# Patient Record
Sex: Male | Born: 1945 | Race: White | Hispanic: No | Marital: Married | Smoking: Never smoker
Health system: Southern US, Community
[De-identification: ages and names within clinical notes are randomized; demographics above are authoritative.]

## PROBLEM LIST (undated history)

## (undated) DIAGNOSIS — F809 Developmental disorder of speech and language, unspecified: Secondary | ICD-10-CM

## (undated) DIAGNOSIS — I10 Essential (primary) hypertension: Secondary | ICD-10-CM

## (undated) DIAGNOSIS — R479 Unspecified speech disturbances: Secondary | ICD-10-CM

## (undated) DIAGNOSIS — J189 Pneumonia, unspecified organism: Secondary | ICD-10-CM

## (undated) DIAGNOSIS — I679 Cerebrovascular disease, unspecified: Secondary | ICD-10-CM

## (undated) DIAGNOSIS — E7800 Pure hypercholesterolemia, unspecified: Secondary | ICD-10-CM

## (undated) DIAGNOSIS — M109 Gout, unspecified: Secondary | ICD-10-CM

## (undated) DIAGNOSIS — G4700 Insomnia, unspecified: Secondary | ICD-10-CM

## (undated) DIAGNOSIS — M1990 Unspecified osteoarthritis, unspecified site: Secondary | ICD-10-CM

## (undated) HISTORY — DX: Developmental disorder of speech and language, unspecified: F80.9

## (undated) HISTORY — DX: Pneumonia, unspecified organism: J18.9

## (undated) HISTORY — DX: Unspecified osteoarthritis, unspecified site: M19.90

## (undated) HISTORY — DX: Cerebrovascular disease, unspecified: I67.9

## (undated) HISTORY — DX: Unspecified speech disturbances: R47.9

## (undated) HISTORY — DX: Pure hypercholesterolemia, unspecified: E78.00

## (undated) HISTORY — PX: TONSILECTOMY, ADENOIDECTOMY, BILATERAL MYRINGOTOMY AND TUBES: SHX2538

## (undated) HISTORY — DX: Insomnia, unspecified: G47.00

## (undated) HISTORY — DX: Essential (primary) hypertension: I10

## (undated) HISTORY — DX: Gout, unspecified: M10.9

## (undated) HISTORY — PX: HEMORRHOID SURGERY: SHX153

---

## 1998-05-19 ENCOUNTER — Encounter: Payer: Self-pay | Admitting: *Deleted

## 1998-05-19 ENCOUNTER — Ambulatory Visit (HOSPITAL_COMMUNITY): Admission: RE | Admit: 1998-05-19 | Discharge: 1998-05-19 | Payer: Self-pay | Admitting: *Deleted

## 2002-04-01 DIAGNOSIS — I679 Cerebrovascular disease, unspecified: Secondary | ICD-10-CM

## 2002-04-01 HISTORY — DX: Cerebrovascular disease, unspecified: I67.9

## 2002-10-12 ENCOUNTER — Inpatient Hospital Stay (HOSPITAL_COMMUNITY): Admission: EM | Admit: 2002-10-12 | Discharge: 2002-10-15 | Payer: Self-pay | Admitting: Emergency Medicine

## 2002-10-12 ENCOUNTER — Encounter: Payer: Self-pay | Admitting: Emergency Medicine

## 2002-10-12 ENCOUNTER — Encounter: Payer: Self-pay | Admitting: Neurology

## 2002-10-13 ENCOUNTER — Encounter: Payer: Self-pay | Admitting: Neurology

## 2002-10-14 ENCOUNTER — Encounter: Payer: Self-pay | Admitting: Cardiology

## 2002-10-20 ENCOUNTER — Encounter: Admission: RE | Admit: 2002-10-20 | Discharge: 2002-10-20 | Payer: Self-pay | Admitting: Neurology

## 2004-06-29 ENCOUNTER — Encounter (INDEPENDENT_AMBULATORY_CARE_PROVIDER_SITE_OTHER): Payer: Self-pay | Admitting: *Deleted

## 2004-06-29 ENCOUNTER — Ambulatory Visit (HOSPITAL_COMMUNITY): Admission: RE | Admit: 2004-06-29 | Discharge: 2004-06-29 | Payer: Self-pay | Admitting: Surgery

## 2008-05-24 ENCOUNTER — Emergency Department (HOSPITAL_COMMUNITY): Admission: EM | Admit: 2008-05-24 | Discharge: 2008-05-24 | Payer: Self-pay | Admitting: Emergency Medicine

## 2008-05-28 ENCOUNTER — Emergency Department (HOSPITAL_COMMUNITY): Admission: EM | Admit: 2008-05-28 | Discharge: 2008-05-28 | Payer: Self-pay | Admitting: Emergency Medicine

## 2010-07-17 LAB — COMPREHENSIVE METABOLIC PANEL
Alkaline Phosphatase: 181 U/L — ABNORMAL HIGH (ref 39–117)
BUN: 13 mg/dL (ref 6–23)
Chloride: 106 mEq/L (ref 96–112)
Creatinine, Ser: 0.94 mg/dL (ref 0.4–1.5)
Glucose, Bld: 102 mg/dL — ABNORMAL HIGH (ref 70–99)
Potassium: 3.7 mEq/L (ref 3.5–5.1)
Total Bilirubin: 1.3 mg/dL — ABNORMAL HIGH (ref 0.3–1.2)
Total Protein: 5.8 g/dL — ABNORMAL LOW (ref 6.0–8.3)

## 2010-07-17 LAB — URINE MICROSCOPIC-ADD ON

## 2010-07-17 LAB — POCT I-STAT, CHEM 8
BUN: 19 mg/dL (ref 6–23)
Chloride: 106 mEq/L (ref 96–112)
Creatinine, Ser: 1.3 mg/dL (ref 0.4–1.5)
Glucose, Bld: 125 mg/dL — ABNORMAL HIGH (ref 70–99)
HCT: 43 % (ref 39.0–52.0)
Potassium: 3.7 mEq/L (ref 3.5–5.1)

## 2010-07-17 LAB — DIFFERENTIAL
Basophils Absolute: 0 10*3/uL (ref 0.0–0.1)
Basophils Relative: 1 % (ref 0–1)
Basophils Relative: 1 % (ref 0–1)
Lymphocytes Relative: 15 % (ref 12–46)
Lymphocytes Relative: 5 % — ABNORMAL LOW (ref 12–46)
Lymphs Abs: 0.4 10*3/uL — ABNORMAL LOW (ref 0.7–4.0)
Monocytes Relative: 1 % — ABNORMAL LOW (ref 3–12)
Neutro Abs: 4.6 10*3/uL (ref 1.7–7.7)
Neutro Abs: 7.9 10*3/uL — ABNORMAL HIGH (ref 1.7–7.7)
Neutrophils Relative %: 70 % (ref 43–77)
Neutrophils Relative %: 94 % — ABNORMAL HIGH (ref 43–77)

## 2010-07-17 LAB — URINALYSIS, ROUTINE W REFLEX MICROSCOPIC
Bilirubin Urine: NEGATIVE
Glucose, UA: NEGATIVE mg/dL
Hgb urine dipstick: NEGATIVE
Nitrite: NEGATIVE
Specific Gravity, Urine: 1.028 (ref 1.005–1.030)
Specific Gravity, Urine: 1.03 (ref 1.005–1.030)
Urobilinogen, UA: 1 mg/dL (ref 0.0–1.0)
pH: 6 (ref 5.0–8.0)

## 2010-07-17 LAB — CBC
HCT: 35.9 % — ABNORMAL LOW (ref 39.0–52.0)
Hemoglobin: 12.6 g/dL — ABNORMAL LOW (ref 13.0–17.0)
MCV: 87.9 fL (ref 78.0–100.0)
RBC: 4.08 MIL/uL — ABNORMAL LOW (ref 4.22–5.81)
RBC: 4.71 MIL/uL (ref 4.22–5.81)
RDW: 13.3 % (ref 11.5–15.5)
WBC: 6.6 10*3/uL (ref 4.0–10.5)
WBC: 8.4 10*3/uL (ref 4.0–10.5)

## 2010-07-17 LAB — LIPASE, BLOOD: Lipase: 24 U/L (ref 11–59)

## 2010-08-17 NOTE — Op Note (Signed)
NAME:  DONTAYE, HUR NO.:  [id number]   MEDICAL RECORD NO.:  [id number]          PATIENT TYPE:  AMB   LOCATION:  DAY                          FACILITY:  [facility]   PHYSICIAN:  Thornton Park. Daphine Deutscher, MD  DATE OF BIRTH:  [birth year]   DATE OF PROCEDURE:  06/29/2004  DATE OF DISCHARGE:                                 OPERATIVE REPORT   PREOPERATIVE DIAGNOSIS:  Prolapsing and bleeding hemorrhoids.   POSTOPERATIVE DIAGNOSIS:  Prolapsing and bleeding hemorrhoids.   PROCEDURE:  Procedure for prolapsing hemorrhoids.   ASSISTANT:  Vikki Ports, M.D.   ANESTHESIA:  General in the prone position.   DESCRIPTION OF PROCEDURE:  Kyron Schlitt was seen again by me in the  holding area with his wife in accompaniment and I described the procedure  with him in detail, including potential risks and benefits and  complications.  The patient was then taken back to room 11 on June 29, 2004  and given general anesthesia.  He was rolled over into the prone position.  The patient's anus was dilated to 2 fingerbreadths manually.  The area was  then taped with the buttocks apart and he was injected with Wydase into the  hemorrhoidal columns.  Then, 20 cc of 0.5% Marcaine were injected into the  sphincters.   I then used the anal retractor.  With the anal retractor in place, we began  the sewing posteriorly in a concentric fashion, taking purchases of the  mucosa with the 2-0 Prolene.  This was carried around at about 4 cm in from  the dentate line.  When this was completed, we withdrew this dilator and  then inserted the PPH dilator.  We then inserted the stapler through the  purse-string and tied this concentric purse-string down around the anvil.  I  then brought each end through so I could maintain tension and then close the  stapler, maintaining the alignment of the anal retractor.  When closed, it  was clamped, released a minute, and then fired.  A half turn enabled me to  remove the entire stapler with the intact ring on the inside.  This was a  nice uniform ring that contained multiple hemorrhoidal areas.   I then went back in with a smaller anal retractor and found one little area  of bleeding posteriorly, which was closed with simple 4-0 Vicryl.  The  remaining portion of the staple line was dry.  A pack of Gelfoam was then  placed in there.  The patient was taken to the recovery room in satisfactory  condition.  He was given a prescription for Tylox to take for pain.  He will  be followed up in the office in approximately 3 weeks.      MBM/MEDQ  D:  06/29/2004  T:  06/29/2004  Job:  [id number]   cc:   Olga Millers, M.D. [facility]   Marlan Palau, M.D.  [address]  [address]  [place]  [place] [address]  Fax: [phone]   Elana Alm. Nicholos Johns, M.D.  [address]  [place]  [place] [address]  Fax: [phone]

## 2010-08-17 NOTE — H&P (Signed)
NAME:  Joe Richards, Joe Richards                     ACCOUNT NO.:  [id number]   MEDICAL RECORD NO.:  [id number]                   PATIENT TYPE:  EMS   LOCATION:  MAJO                                 FACILITY:  [facility]   PHYSICIAN:  Genene Churn. Love, M.D.                 DATE OF BIRTH:  [birth year]   DATE OF ADMISSION:  10/12/2002  DATE OF DISCHARGE:                                HISTORY & PHYSICAL   This is the first Joe Richards hospital admission for this 65 year old right  handed white married male from [place], [place], seen in  consultation and admitted for evaluation of dysarthria and dysphagia.   HISTORY OF PRESENT ILLNESS:  Mr. Gildner has no known history of high  blood pressure, diabetes, heart disease, or stroke. He was in his usual  state of health until October 12, 2002, when he developed acute onset of sleep  disturbance and swallowing difficulties at about 6 a.m. At that time, he was  eating cereal. There was no associated chest pain, palpitations, focal arm  or leg weakness, numbness, double vision, hiccups, swallowing problems, or  nausea and vomiting. His son was called who brought him to the emergency  room about 3:00 in the afternoon, and he was seen by Dr. Lynelle Doctor who referred  him for neurologic consultation. The patient has not been on aspirin. There  is no history of hyperlipidemia. No history of drug use. No known history of  migraine and denies any head or neck trauma. He has had no symptoms like  this previously in his life.   His past medical history is significant for gout. He currently takes no  medications. He does not have a position. He is educated through high school  and works as a Curator. He is married and has a son, 55, and daughter, 27,  living and well.   FAMILY HISTORY:  His mother died at 38 with diabetes mellitus. Father died  of 12 with a gunshot wound and suicide. He has a brother, 30, and a brother,  72, and sister, 31, all of whom are  living and well. There is no family  history of other serious medical problems.   PHYSICAL EXAMINATION:  GENERAL:  Revealed a well-developed white male.  VITAL SIGNS:  Blood pressure in the right and left arm 140/80, heart rate 50  and regular. Were no carotid or supraclavicular bruits.  HEENT:  The tympanic membranes were clear.  NECK:  Supple.  NEUROLOGICAL:  Mental status:  He was alert and oriented x3 and followed  one, two, and three step commands. Cranial nerve examination was significant  for dysarthria. His pupils:  Left __________ than right; it was about 4.5,  the right was 4. There was no definite ptosis. The extraocular movements  were full. There was no nystagmus. The red lens testing revealed no definite  diplopia. His corneals were present. Facial sensation was equal. I could  not  put up any facial motor asymmetric, but he did have a mustache. His tongue  was midline. The uvula was midline and gag was in place. He had a  significant dysarthria. Sternocleidomastoid, trapezius testing normal motor  examination with good strength in the upper and lower extremities. He did  not have a significant tremor. Finger to nose and heel to shin were well  done. Sensory examination was intact to pinprick, touch, position, and  vibration testing. Deep tendon reflexes were 1 to 2+. Plantar responses were  downgoing. Gait examination revealed that he could stand by the bedside. He  could stand on his toes. He could stand on his heels. He had a tendency to  fall to his right on standing.  GENERAL EXAMINATION:  The general examination revealed tympanic membranes to  the clear. Mouth was in good repair.  LUNGS:  Clear to auscultation.  HEART:  Examination of the heart revealed no murmurs.  ABDOMEN:  Bowel sounds were normal.  EXTREMITIES:  There was no clubbing, cyanosis, or edema in the extremities.   IMPRESSION:  1. Dysarthria, code 784.5.  2. Dysphagia, code 787.2.  3. Gout, code  unknown.   PLAN:  At this time to give the patient rectal aspirin and admit him for  further evaluation and consideration such as brain stem stroke, myasthenia  gravis, and possibly even mass lesion in the posterior fossa may need to be  considered. Etiology of this symptomatology at this time is not clear.                                               Genene Churn. Sandria Manly, M.D.    JML/MEDQ  D:  10/12/2002  T:  10/12/2002  Job:  [id number]

## 2010-08-17 NOTE — Discharge Summary (Signed)
NAME:  Joe Richards, Joe Richards                     ACCOUNT NO.:  [id number]   MEDICAL RECORD NO.:  [id number]                   PATIENT TYPE:  INP   LOCATION:  3021                                 FACILITY:  [facility]   PHYSICIAN:  Melvyn Novas, M.D.               DATE OF BIRTH:  09/16/1945   DATE OF ADMISSION:  10/12/2002  DATE OF DISCHARGE:  10/15/2002                                 DISCHARGE SUMMARY   DISCHARGE DIAGNOSES:  1. 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  2. Gout.   DISCHARGE MEDICATIONS:  Aspirin 325 mg daily.   STUDIES PERFORMED:  1. CT of the head on admission shows no acute abnormalities.  2. Chest x-ray shows no active lung disease, mild cardiomegaly.  3. MRI of the brain shows acute infarct of the left parietal lobe.  4. MRA of the head was negative.  5. 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  6. Carotid Dopplers which were normal.  7. A 2-D echocardiogram - normal left ejection fraction of 55 to 65%, no     left ventricular regional wall abnormalities, no echocardiographic source     of cardiac embolism.  8. A transesophageal echocardiogram performed by Dr. Olga Millers on October 15, 2002, shows normal left ventricular function, no LA thrombus, no     pericardial effusion, mild atherosclerosis of the descending aorta,     normal trileaflet aortic valve, mildly thickened mitral valve with trace     of mitral regurgitation with oval density on mitral valve, most likely     fibrin stranding, trace tricuspid regurgitation, no IA,  by color,     negative __________ microcavitation study.  No complications.  The     patient tolerated it well.  9. EKG showing normal sinus rhythm.   LABORATORY DATA:  Serum drug screen negative.  Homocystine 9.41.  Sedimentation rate 9.  ANA pending.  Anticardiolipin antibody pending.  Lupus anticoagulant normal.  Protein C total pending.  Protein S  fractional  109, protein C fractional 130.  White blood cell count 5.4, red blood cells  4.8, hemoglobin 14.4, hematocrit 41.1, platelets 197, differential normal.  Sodium 142, potassium 3.7, chloride 106, CO2 30, glucose 97, BUN 14,  creatinine 0.9, calcium 8.9, total protein 6.8, albumin 3.9.  AST 20, ALT  27, ALP 90, total bilirubin 0.8.  Lipid profile with cholesterol 156,  triglycerides 159, HDL 35, and LDL 89.  UA showed small leukocyte esterase  with 0 to 2 red blood cells and white blood cells.   HISTORY OF PRESENT ILLNESS:  Joe Richards is a 65 year old right-  handed white male with no significant medical history.  He developed acute  onset of sleep disturbance, with swallowing difficulties about 6 a.m. on the  morning of admission while eating cereal.  There was no associated symptoms  with it.  His son was called and brought him to the emergency room at about  3:00 in the afternoon, and was asked by the EDP to be seen by a neurologist.  He was out of the TPA window.  He was admitted for further stroke workup.   HOSPITAL COURSE:  MRI did reveal an acute left parietal infarction of  moderate size.  Workup was negative for etiology, including a carotid  Doppler, MRA, 2D echo, and transesophageal echocardiogram performed by Dr.  Olga Millers.  The patient was on aspirin prior to admission, thus was  discharged on aspirin 325 mg a day for secondary stroke prevention.  No  other risk factors for stroke were identified.   He remained with good strength and sensation during hospitalization.  Speech  therapy did see him and assessed him to tolerate a dysphagia III thin liquid  diet.  He will have an outpatient speech therapy followup.  Hypercoagulable  workup was initiated; this will also need followup.   CONDITION ON DISCHARGE:  The patient is alert and oriented x3.  Speech is  slightly slurred with some anomia remaining.  He does have decreased  spontaneous speech.  He  is able to repeat.  His chest is clear to  auscultation.  His heart rate is regular.  His strength is normal without  drift.   DISCHARGE PLAN:  Discharged home with followup outpatient speech therapy.   DISCHARGE MEDICATIONS:  Aspirin for secondary stroke prevention.   FOLLOWUP:  1. Hypercoagulable workup as an outpatient.  2. Follow up with Dr. Anne Hahn in the office in three to four weeks.     Annie Main, N.P.                         Melvyn Novas, M.D.    SB/MEDQ  D:  10/15/2002  T:  10/17/2002  Job:  [id number]   cc:   Marlan Palau, M.D.  [address]  [address]  [place]  [place] [address]  Fax: [phone]   Olga Millers, M.D.    cc:   Marlan Palau, M.D.  [address]  [address]  [place]  [place] [address]  Fax: [phone]   Olga Millers, M.D.

## 2011-08-13 DIAGNOSIS — R42 Dizziness and giddiness: Secondary | ICD-10-CM | POA: Diagnosis not present

## 2011-08-13 DIAGNOSIS — I658 Occlusion and stenosis of other precerebral arteries: Secondary | ICD-10-CM | POA: Diagnosis not present

## 2011-08-21 ENCOUNTER — Encounter: Payer: Self-pay | Admitting: Gastroenterology

## 2011-09-20 ENCOUNTER — Encounter: Payer: Self-pay | Admitting: Gastroenterology

## 2011-09-20 ENCOUNTER — Ambulatory Visit (INDEPENDENT_AMBULATORY_CARE_PROVIDER_SITE_OTHER): Payer: BC Managed Care – PPO | Admitting: Gastroenterology

## 2011-09-20 VITALS — BP 140/64 | HR 78 | Ht 65.0 in | Wt 151.0 lb

## 2011-09-20 DIAGNOSIS — K921 Melena: Secondary | ICD-10-CM

## 2011-09-20 NOTE — Progress Notes (Signed)
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  Recent physical.  +FOBT stools.  CBC last month shows normal hemoglobin, normal MCV.  Had hemorrhoid surgery 4-5 years, stapling. Had minor bleeding with hard stool over the weekend.   Constipation however is not generally much of a bother for him. He has no significant abdominal pains.  Weight is a bit higher than his usual.   Review of systems: Pertinent positive and negative review of systems were noted in the above HPI section. Complete review of systems was performed and was otherwise normal.    Past Medical History  Diagnosis Date  . Cerebrovascular disease 2004  . Speech and language deficits   . HTN (hypertension)   . Pneumonia   . Insomnia   . OA (osteoarthritis)     shoulder  . Gout   . Hypercholesterolemia     Past Surgical History  Procedure Date  . Tonsilectomy, adenoidectomy, bilateral myringotomy and tubes     as child  . Hemorrhoid surgery     Current Outpatient Prescriptions  Medication Sig Dispense Refill  . aspirin 81 MG tablet Take 81 mg by mouth daily.      Marland Kitchen atorvastatin (LIPITOR) 10 MG tablet Take 10 mg by mouth daily.      Marland Kitchen losartan (COZAAR) 50 MG tablet Take 50 mg by mouth daily.        Allergies as of 09/20/2011  . (Not on File)    Family History  Problem Relation Age of Onset  . Sudden death Father     suicide  . Diabetes Mother   . Hypertension Mother   . Gout Mother     History   Social History  . Marital Status: Married    Spouse Name: N/A    Number of Children: 2  . Years of Education: N/A   Occupational History  . Curator    Social History Main Topics  . Smoking status: Never Smoker   . Smokeless tobacco: Not on file  . Alcohol Use: No  . Drug Use: No  . Sexually Active: Not on file   Other Topics Concern  . Not on file   Social History Narrative  . No narrative on file       Physical Exam: BP 140/64  Pulse 78  Ht 5\' 5"  (1.651 m)   Wt 151 lb (68.493 kg)  BMI 25.13 kg/m2 Constitutional: generally well-appearing Psychiatric: alert and oriented x3 Eyes: extraocular movements intact Mouth: oral pharynx moist, no lesions Neck: supple no lymphadenopathy Cardiovascular: heart regular rate and rhythm Lungs: clear to auscultation bilaterally Abdomen: soft, nontender, nondistended, no obvious ascites, no peritoneal signs, normal bowel sounds Extremities: no lower extremity edema bilaterally Skin: no lesions on visible extremities    Assessment and plan: 66 y.o. male with  Hemoccult-positive stool  We will again request records from his previous gastroenterologist in [place].  He will likely need repeat colonoscopy unless that colonoscopy was performed in the last year or 2 which I doubt.

## 2011-09-20 NOTE — Patient Instructions (Addendum)
Will again ask for records from [place] (recent colonoscoy, any associated pathology reports). You will likely need a repeat colonosopy now depending on review of old records, [place].

## 2012-02-07 DIAGNOSIS — M19019 Primary osteoarthritis, unspecified shoulder: Secondary | ICD-10-CM | POA: Diagnosis not present

## 2012-02-07 DIAGNOSIS — I10 Essential (primary) hypertension: Secondary | ICD-10-CM | POA: Diagnosis not present

## 2012-02-07 DIAGNOSIS — K921 Melena: Secondary | ICD-10-CM | POA: Diagnosis not present

## 2012-02-07 DIAGNOSIS — N400 Enlarged prostate without lower urinary tract symptoms: Secondary | ICD-10-CM | POA: Diagnosis not present

## 2012-08-13 DIAGNOSIS — M109 Gout, unspecified: Secondary | ICD-10-CM | POA: Diagnosis not present

## 2012-08-13 DIAGNOSIS — N400 Enlarged prostate without lower urinary tract symptoms: Secondary | ICD-10-CM | POA: Diagnosis not present

## 2012-08-13 DIAGNOSIS — R42 Dizziness and giddiness: Secondary | ICD-10-CM | POA: Diagnosis not present

## 2012-08-13 DIAGNOSIS — I10 Essential (primary) hypertension: Secondary | ICD-10-CM | POA: Diagnosis not present

## 2012-08-13 DIAGNOSIS — I679 Cerebrovascular disease, unspecified: Secondary | ICD-10-CM | POA: Diagnosis not present

## 2012-08-13 DIAGNOSIS — Z0000 Encounter for general adult medical examination without abnormal findings: Secondary | ICD-10-CM | POA: Diagnosis not present

## 2013-02-15 DIAGNOSIS — I10 Essential (primary) hypertension: Secondary | ICD-10-CM | POA: Diagnosis not present

## 2013-02-15 DIAGNOSIS — Z23 Encounter for immunization: Secondary | ICD-10-CM | POA: Diagnosis not present

## 2013-02-15 DIAGNOSIS — N400 Enlarged prostate without lower urinary tract symptoms: Secondary | ICD-10-CM | POA: Diagnosis not present

## 2013-02-15 DIAGNOSIS — J3489 Other specified disorders of nose and nasal sinuses: Secondary | ICD-10-CM | POA: Diagnosis not present

## 2013-02-15 DIAGNOSIS — M19019 Primary osteoarthritis, unspecified shoulder: Secondary | ICD-10-CM | POA: Diagnosis not present

## 2013-02-15 DIAGNOSIS — G4700 Insomnia, unspecified: Secondary | ICD-10-CM | POA: Diagnosis not present

## 2013-02-15 DIAGNOSIS — Z1331 Encounter for screening for depression: Secondary | ICD-10-CM | POA: Diagnosis not present

## 2013-02-15 DIAGNOSIS — M109 Gout, unspecified: Secondary | ICD-10-CM | POA: Diagnosis not present

## 2013-02-15 DIAGNOSIS — I679 Cerebrovascular disease, unspecified: Secondary | ICD-10-CM | POA: Diagnosis not present

## 2013-09-15 DIAGNOSIS — M109 Gout, unspecified: Secondary | ICD-10-CM | POA: Diagnosis not present

## 2013-09-15 DIAGNOSIS — R7301 Impaired fasting glucose: Secondary | ICD-10-CM | POA: Diagnosis not present

## 2013-09-15 DIAGNOSIS — G4700 Insomnia, unspecified: Secondary | ICD-10-CM | POA: Diagnosis not present

## 2013-09-15 DIAGNOSIS — Z0000 Encounter for general adult medical examination without abnormal findings: Secondary | ICD-10-CM | POA: Diagnosis not present

## 2013-09-15 DIAGNOSIS — M19019 Primary osteoarthritis, unspecified shoulder: Secondary | ICD-10-CM | POA: Diagnosis not present

## 2013-09-15 DIAGNOSIS — I10 Essential (primary) hypertension: Secondary | ICD-10-CM | POA: Diagnosis not present

## 2013-09-15 DIAGNOSIS — I679 Cerebrovascular disease, unspecified: Secondary | ICD-10-CM | POA: Diagnosis not present

## 2013-09-15 DIAGNOSIS — N400 Enlarged prostate without lower urinary tract symptoms: Secondary | ICD-10-CM | POA: Diagnosis not present

## 2013-09-17 ENCOUNTER — Ambulatory Visit (INDEPENDENT_AMBULATORY_CARE_PROVIDER_SITE_OTHER): Payer: BC Managed Care – PPO | Admitting: Family Medicine

## 2013-09-17 VITALS — BP 122/80 | HR 73 | Temp 98.3°F | Resp 20 | Ht 66.5 in | Wt 152.0 lb

## 2013-09-17 DIAGNOSIS — R141 Gas pain: Secondary | ICD-10-CM | POA: Diagnosis not present

## 2013-09-17 DIAGNOSIS — R143 Flatulence: Secondary | ICD-10-CM

## 2013-09-17 DIAGNOSIS — J310 Chronic rhinitis: Secondary | ICD-10-CM | POA: Diagnosis not present

## 2013-09-17 DIAGNOSIS — T485X5A Adverse effect of other anti-common-cold drugs, initial encounter: Secondary | ICD-10-CM

## 2013-09-17 DIAGNOSIS — J301 Allergic rhinitis due to pollen: Secondary | ICD-10-CM

## 2013-09-17 DIAGNOSIS — R140 Abdominal distension (gaseous): Secondary | ICD-10-CM

## 2013-09-17 DIAGNOSIS — R142 Eructation: Secondary | ICD-10-CM

## 2013-09-17 DIAGNOSIS — R42 Dizziness and giddiness: Secondary | ICD-10-CM | POA: Diagnosis not present

## 2013-09-17 LAB — POCT CBC
Granulocyte percent: 75.4 %G (ref 37–80)
HCT, POC: 43 % — AB (ref 43.5–53.7)
HEMOGLOBIN: 14.1 g/dL (ref 14.1–18.1)
LYMPH, POC: 1.2 (ref 0.6–3.4)
MCH: 29.6 pg (ref 27–31.2)
MCHC: 32.8 g/dL (ref 31.8–35.4)
MCV: 90.3 fL (ref 80–97)
MID (CBC): 0.5 (ref 0–0.9)
MPV: 8.4 fL (ref 0–99.8)
PLATELET COUNT, POC: 214 10*3/uL (ref 142–424)
POC GRANULOCYTE: 5.2 (ref 2–6.9)
POC LYMPH PERCENT: 17.7 %L (ref 10–50)
POC MID %: 6.9 % (ref 0–12)
RBC: 4.76 M/uL (ref 4.69–6.13)
RDW, POC: 14 %
WBC: 6.9 10*3/uL (ref 4.6–10.2)

## 2013-09-17 LAB — POCT URINALYSIS DIPSTICK
Bilirubin, UA: NEGATIVE
GLUCOSE UA: NEGATIVE
Ketones, UA: NEGATIVE
Leukocytes, UA: NEGATIVE
NITRITE UA: NEGATIVE
PH UA: 5
RBC UA: NEGATIVE
SPEC GRAV UA: 1.015
UROBILINOGEN UA: 0.2

## 2013-09-17 LAB — COMPREHENSIVE METABOLIC PANEL
ALK PHOS: 89 U/L (ref 39–117)
ALT: 16 U/L (ref 0–53)
AST: 16 U/L (ref 0–37)
Albumin: 4.7 g/dL (ref 3.5–5.2)
BILIRUBIN TOTAL: 0.8 mg/dL (ref 0.2–1.2)
BUN: 23 mg/dL (ref 6–23)
CHLORIDE: 104 meq/L (ref 96–112)
CO2: 24 mEq/L (ref 19–32)
CREATININE: 1.18 mg/dL (ref 0.50–1.35)
Calcium: 9.3 mg/dL (ref 8.4–10.5)
Glucose, Bld: 118 mg/dL — ABNORMAL HIGH (ref 70–99)
Potassium: 4.5 mEq/L (ref 3.5–5.3)
Sodium: 139 mEq/L (ref 135–145)
Total Protein: 7.4 g/dL (ref 6.0–8.3)

## 2013-09-17 LAB — POCT UA - MICROSCOPIC ONLY
BACTERIA, U MICROSCOPIC: NEGATIVE
CASTS, UR, LPF, POC: NEGATIVE
CRYSTALS, UR, HPF, POC: NEGATIVE
MUCUS UA: NEGATIVE
RBC, urine, microscopic: NEGATIVE
WBC, Ur, HPF, POC: NEGATIVE
YEAST UA: NEGATIVE

## 2013-09-17 LAB — GLUCOSE, POCT (MANUAL RESULT ENTRY): POC GLUCOSE: 109 mg/dL — AB (ref 70–99)

## 2013-09-17 MED ORDER — FLUTICASONE PROPIONATE 50 MCG/ACT NA SUSP: NASAL | Status: DC

## 2013-09-17 MED ORDER — MECLIZINE HCL 25 MG PO TABS: ORAL_TABLET | ORAL | Status: AC

## 2013-09-17 NOTE — Progress Notes (Signed)
Subjective: 68 year old man with history of waking up in the night last night with acute severe dizziness. He felt bloated in his abdomen he went to the bathroom unsuccessfully. He had a normal bowel movement yesterday afternoon. He had a little aching in the back of his neck. No vomiting. Has not had a lot of dizziness. He was a little stressed yesterday with going to see a friend in the hospital who's dying. The patient is a [place] veteran, goes to [employer]. He has had some bowel problems in the past, was supposed get a colonoscopy a couple of years ago and apparently that fell through the cracks.  Objective: Alert oriented man. Good coordination. Gait normal. Strength good. TMs normal. Eyes PERRLA. EOMs intact. Fundi benign. Throat clear. Neck supple without nodes thyromegaly. No carotid bruits. Chest is clear to auscultation. Heart regular without murmurs gallops or arrhythmias. Abdomen soft without mass or tenderness.  Assessment: Dizziness Abdominal bloating Neck pain  Plan: CBC, glucose, Cmet  Results for orders placed in visit on 09/17/13  POCT CBC      Result Value Ref Range   WBC 6.9  4.6 - 10.2 K/uL   Lymph, poc 1.2  0.6 - 3.4   POC LYMPH PERCENT 17.7  10 - 50 %L   MID (cbc) 0.5  0 - 0.9   POC MID % 6.9  0 - 12 %M   POC Granulocyte 5.2  2 - 6.9   Granulocyte percent 75.4  37 - 80 %G   RBC 4.76  4.69 - 6.13 M/uL   Hemoglobin 14.1  14.1 - 18.1 g/dL   HCT, POC 43.0 (*) 43.5 - 53.7 %   MCV 90.3  80 - 97 fL   MCH, POC 29.6  27 - 31.2 pg   MCHC 32.8  31.8 - 35.4 g/dL   RDW, POC 14.0     Platelet Count, POC 214  142 - 424 K/uL   MPV 8.4  0 - 99.8 fL  GLUCOSE, POCT (MANUAL RESULT ENTRY)      Result Value Ref Range   POC Glucose 109 (*) 70 - 99 mg/dl  POCT UA - MICROSCOPIC ONLY      Result Value Ref Range   WBC, Ur, HPF, POC neg     RBC, urine, microscopic neg     Bacteria, U Microscopic neg     Mucus, UA neg     Epithelial cells, urine per micros 0-1      Crystals, Ur, HPF, POC neg     Casts, Ur, LPF, POC neg     Yeast, UA neg    POCT URINALYSIS DIPSTICK      Result Value Ref Range   Color, UA yellow     Clarity, UA clear     Glucose, UA neg     Bilirubin, UA neg     Ketones, UA neg     Spec Grav, UA 1.015     Blood, UA neg     pH, UA 5.0     Protein, UA trace     Urobilinogen, UA 0.2     Nitrite, UA neg     Leukocytes, UA Negative

## 2013-09-17 NOTE — Patient Instructions (Signed)
You should hear back from Dr. Ardis Hughs office with regard to your colonoscopy referral. If not over the next week you should call back here and speak to our referrals desk to make sure that is going through.  If the dizziness recurs take the antevert dizziness pills, maximum one pill 3 times daily. They will make your little bit drowsy.  Take over-the-counter Allegra (fexofenadine) one daily for your allergies  Use the fluticasone nose spray also for allergies and to help get you off of the Afrin. Use 2 sprays each nostril twice daily for about 4 days, then once daily  Return if symptoms get abruptly worse at anytime

## 2013-09-21 ENCOUNTER — Encounter: Payer: Self-pay | Admitting: Radiology

## 2013-10-25 ENCOUNTER — Encounter: Payer: Self-pay | Admitting: Family Medicine

## 2014-05-09 DIAGNOSIS — Z6826 Body mass index (BMI) 26.0-26.9, adult: Secondary | ICD-10-CM | POA: Diagnosis not present

## 2014-05-09 DIAGNOSIS — J3489 Other specified disorders of nose and nasal sinuses: Secondary | ICD-10-CM | POA: Diagnosis not present

## 2014-05-09 DIAGNOSIS — J329 Chronic sinusitis, unspecified: Secondary | ICD-10-CM | POA: Diagnosis not present

## 2014-09-20 DIAGNOSIS — R972 Elevated prostate specific antigen [PSA]: Secondary | ICD-10-CM | POA: Diagnosis not present

## 2014-09-20 DIAGNOSIS — M19019 Primary osteoarthritis, unspecified shoulder: Secondary | ICD-10-CM | POA: Diagnosis not present

## 2014-09-20 DIAGNOSIS — E785 Hyperlipidemia, unspecified: Secondary | ICD-10-CM | POA: Diagnosis not present

## 2014-09-20 DIAGNOSIS — R7301 Impaired fasting glucose: Secondary | ICD-10-CM | POA: Diagnosis not present

## 2014-09-20 DIAGNOSIS — M109 Gout, unspecified: Secondary | ICD-10-CM | POA: Diagnosis not present

## 2014-09-20 DIAGNOSIS — G4700 Insomnia, unspecified: Secondary | ICD-10-CM | POA: Diagnosis not present

## 2014-09-20 DIAGNOSIS — I679 Cerebrovascular disease, unspecified: Secondary | ICD-10-CM | POA: Diagnosis not present

## 2014-09-20 DIAGNOSIS — I10 Essential (primary) hypertension: Secondary | ICD-10-CM | POA: Diagnosis not present

## 2014-09-20 DIAGNOSIS — N400 Enlarged prostate without lower urinary tract symptoms: Secondary | ICD-10-CM | POA: Diagnosis not present

## 2014-09-20 DIAGNOSIS — Z6824 Body mass index (BMI) 24.0-24.9, adult: Secondary | ICD-10-CM | POA: Diagnosis not present

## 2014-09-20 DIAGNOSIS — Z0000 Encounter for general adult medical examination without abnormal findings: Secondary | ICD-10-CM | POA: Diagnosis not present

## 2014-09-20 DIAGNOSIS — Z1389 Encounter for screening for other disorder: Secondary | ICD-10-CM | POA: Diagnosis not present

## 2014-11-08 ENCOUNTER — Other Ambulatory Visit: Payer: Self-pay | Admitting: Family Medicine

## 2015-02-19 ENCOUNTER — Other Ambulatory Visit: Payer: Self-pay | Admitting: Physician Assistant

## 2015-03-03 DIAGNOSIS — J00 Acute nasopharyngitis [common cold]: Secondary | ICD-10-CM | POA: Diagnosis not present

## 2015-03-03 DIAGNOSIS — R972 Elevated prostate specific antigen [PSA]: Secondary | ICD-10-CM | POA: Diagnosis not present

## 2015-03-03 DIAGNOSIS — J309 Allergic rhinitis, unspecified: Secondary | ICD-10-CM | POA: Diagnosis not present

## 2015-03-03 DIAGNOSIS — Z6824 Body mass index (BMI) 24.0-24.9, adult: Secondary | ICD-10-CM | POA: Diagnosis not present

## 2015-11-17 DIAGNOSIS — L237 Allergic contact dermatitis due to plants, except food: Secondary | ICD-10-CM | POA: Diagnosis not present

## 2015-11-17 DIAGNOSIS — Z6824 Body mass index (BMI) 24.0-24.9, adult: Secondary | ICD-10-CM | POA: Diagnosis not present

## 2015-11-25 ENCOUNTER — Emergency Department (HOSPITAL_COMMUNITY): Payer: BLUE CROSS/BLUE SHIELD

## 2015-11-25 ENCOUNTER — Emergency Department (HOSPITAL_COMMUNITY)
Admission: EM | Admit: 2015-11-25 | Discharge: 2015-11-25 | Disposition: A | Discharge status: Home / Self Care | Payer: BLUE CROSS/BLUE SHIELD | Attending: Emergency Medicine | Admitting: Emergency Medicine

## 2015-11-25 ENCOUNTER — Encounter (HOSPITAL_COMMUNITY): Payer: Self-pay | Admitting: Emergency Medicine

## 2015-11-25 DIAGNOSIS — Z8673 Personal history of transient ischemic attack (TIA), and cerebral infarction without residual deficits: Secondary | ICD-10-CM | POA: Insufficient documentation

## 2015-11-25 DIAGNOSIS — N201 Calculus of ureter: Secondary | ICD-10-CM | POA: Diagnosis not present

## 2015-11-25 DIAGNOSIS — N132 Hydronephrosis with renal and ureteral calculous obstruction: Secondary | ICD-10-CM | POA: Insufficient documentation

## 2015-11-25 DIAGNOSIS — I10 Essential (primary) hypertension: Secondary | ICD-10-CM | POA: Diagnosis not present

## 2015-11-25 DIAGNOSIS — Z7982 Long term (current) use of aspirin: Secondary | ICD-10-CM | POA: Diagnosis not present

## 2015-11-25 DIAGNOSIS — R109 Unspecified abdominal pain: Secondary | ICD-10-CM | POA: Diagnosis present

## 2015-11-25 DIAGNOSIS — Z79899 Other long term (current) drug therapy: Secondary | ICD-10-CM | POA: Diagnosis not present

## 2015-11-25 DIAGNOSIS — N200 Calculus of kidney: Secondary | ICD-10-CM

## 2015-11-25 DIAGNOSIS — N130 Hydronephrosis with ureteropelvic junction obstruction: Secondary | ICD-10-CM | POA: Diagnosis not present

## 2015-11-25 LAB — COMPREHENSIVE METABOLIC PANEL
ALBUMIN: 3.5 g/dL (ref 3.5–5.0)
ALK PHOS: 76 U/L (ref 38–126)
ALT: 21 U/L (ref 17–63)
AST: 21 U/L (ref 15–41)
Anion gap: 8 (ref 5–15)
BUN: 25 mg/dL — ABNORMAL HIGH (ref 6–20)
CALCIUM: 8.6 mg/dL — AB (ref 8.9–10.3)
CHLORIDE: 107 mmol/L (ref 101–111)
CO2: 25 mmol/L (ref 22–32)
CREATININE: 1.41 mg/dL — AB (ref 0.61–1.24)
GFR calc Af Amer: 57 mL/min — ABNORMAL LOW (ref >60)
GFR calc non Af Amer: 49 mL/min — ABNORMAL LOW (ref >60)
GLUCOSE: 171 mg/dL — AB (ref 65–99)
Potassium: 4 mmol/L (ref 3.5–5.1)
SODIUM: 140 mmol/L (ref 135–145)
Total Bilirubin: 0.8 mg/dL (ref 0.3–1.2)
Total Protein: 6.1 g/dL — ABNORMAL LOW (ref 6.5–8.1)

## 2015-11-25 LAB — URINALYSIS, ROUTINE W REFLEX MICROSCOPIC
BILIRUBIN URINE: NEGATIVE
GLUCOSE, UA: NEGATIVE mg/dL
Ketones, ur: NEGATIVE mg/dL
Leukocytes, UA: NEGATIVE
Nitrite: NEGATIVE
PROTEIN: NEGATIVE mg/dL
SPECIFIC GRAVITY, URINE: 1.019 (ref 1.005–1.030)
pH: 7.5 (ref 5.0–8.0)

## 2015-11-25 LAB — CBC
HCT: 42.7 % (ref 39.0–52.0)
Hemoglobin: 14.2 g/dL (ref 13.0–17.0)
MCH: 29.8 pg (ref 26.0–34.0)
MCHC: 33.3 g/dL (ref 30.0–36.0)
MCV: 89.5 fL (ref 78.0–100.0)
PLATELETS: 177 10*3/uL (ref 150–400)
RBC: 4.77 MIL/uL (ref 4.22–5.81)
RDW: 12.5 % (ref 11.5–15.5)
WBC: 10.9 10*3/uL — ABNORMAL HIGH (ref 4.0–10.5)

## 2015-11-25 LAB — URINE MICROSCOPIC-ADD ON: BACTERIA UA: NONE SEEN

## 2015-11-25 LAB — LIPASE, BLOOD: Lipase: 80 U/L — ABNORMAL HIGH (ref 11–51)

## 2015-11-25 MED ORDER — SODIUM CHLORIDE 0.9 % IV BOLUS (SEPSIS)
1000.0000 mL | Freq: Once | INTRAVENOUS | Status: AC
  Administered 2015-11-25: 1000 mL via INTRAVENOUS

## 2015-11-25 MED ORDER — HYDROMORPHONE HCL 1 MG/ML IJ SOLN
1.0000 mg | Freq: Once | INTRAMUSCULAR | Status: AC
  Administered 2015-11-25: 1 mg via INTRAVENOUS
  Filled 2015-11-25: qty 1

## 2015-11-25 MED ORDER — OXYCODONE-ACETAMINOPHEN 5-325 MG PO TABS: 1.0000 | ORAL_TABLET | ORAL | 0 refills | Status: AC | PRN

## 2015-11-25 NOTE — ED Provider Notes (Signed)
[facility] DEPT Provider Note   CSN: [id number] Arrival date & time: 11/25/15  1600     History   Chief Complaint Chief Complaint  Patient presents with  . Hematuria  . Abdominal Pain  . Flank Pain    HPI Joe Richards is a 70 y.o. male.  HPI  Past Medical History:  Diagnosis Date  . Cerebrovascular disease 2004  . Gout   . HTN (hypertension)   . Hypercholesterolemia   . Insomnia   . OA (osteoarthritis)    shoulder  . Pneumonia   . Speech and language deficits     There are no active problems to display for this patient.   Past Surgical History:  Procedure Laterality Date  . HEMORRHOID SURGERY    . TONSILECTOMY, ADENOIDECTOMY, BILATERAL MYRINGOTOMY AND TUBES     as child       Home Medications    Prior to Admission medications   Medication Sig Start Date End Date Taking? Authorizing Provider  aspirin 81 MG tablet Take 81 mg by mouth daily.    Historical Provider, MD  atorvastatin (LIPITOR) 10 MG tablet Take 10 mg by mouth daily.    Historical Provider, MD  fluticasone (FLONASE) 50 MCG/ACT nasal spray USE 2 SPRAYS EACH NOSTRIL TWICE DAILY FOR 4 DAYS, THEN DECREASE TO ONCE DAILY.  "OV NEEDED FOR REFILLS" 11/09/14   Mancel Bale, PA-C  losartan (COZAAR) 50 MG tablet Take 50 mg by mouth daily.    Historical Provider, MD  meclizine (ANTIVERT) 25 MG tablet Take one tablet 3 times daily only if needed for dizziness 09/17/13   Posey Boyer, MD  oxyCODONE-acetaminophen (PERCOCET/ROXICET) 5-325 MG tablet Take 1 tablet by mouth every 4 (four) hours as needed for moderate pain. 11/25/15   Chapman Moss, MD    Family History Family History  Problem Relation Age of Onset  . Sudden death Father     suicide  . Diabetes Mother   . Hypertension Mother   . Gout Mother     Social History Social History  Substance Use Topics  . Smoking status: Never Smoker  . Smokeless tobacco: Never Used  . Alcohol use No     Allergies   Review of patient's allergies  indicates no known allergies.   Review of Systems Review of Systems  Constitutional: Negative for chills and fever.  Respiratory: Negative for chest tightness and shortness of breath.   Cardiovascular: Negative for chest pain.  Gastrointestinal: Negative for abdominal pain and blood in stool.  Genitourinary: Positive for dysuria, flank pain and hematuria. Negative for discharge, penile pain and testicular pain.  All other systems reviewed and are negative.    Physical Exam Updated Vital Signs BP 107/66   Pulse (!) 54   Temp 98.3 F (36.8 C) (Oral)   Resp 14   Ht 5\' 6"  (1.676 m)   Wt 66.7 kg   SpO2 93%   BMI 23.73 kg/m   Physical Exam  Constitutional: He appears well-developed and well-nourished.  HENT:  Head: Normocephalic and atraumatic.  Eyes: Conjunctivae are normal.  Neck: Neck supple.  Cardiovascular: Normal rate and regular rhythm.   No murmur heard. Pulmonary/Chest: Effort normal and breath sounds normal. No respiratory distress.  Abdominal: Soft. He exhibits no mass. There is tenderness. There is no rebound and no guarding.  Musculoskeletal: He exhibits no edema.  Neurological: He is alert.  Skin: Skin is warm and dry.  Psychiatric: He has a normal mood and affect.  Nursing note  and vitals reviewed.    ED Treatments / Results  Labs (all labs ordered are listed, but only abnormal results are displayed) Labs Reviewed  LIPASE, BLOOD - Abnormal; Notable for the following:       Result Value   Lipase 80 (*)    All other components within normal limits  COMPREHENSIVE METABOLIC PANEL - Abnormal; Notable for the following:    Glucose, Bld 171 (*)    BUN 25 (*)    Creatinine, Ser 1.41 (*)    Calcium 8.6 (*)    Total Protein 6.1 (*)    GFR calc non Af Amer 49 (*)    GFR calc Af Amer 57 (*)    All other components within normal limits  CBC - Abnormal; Notable for the following:    WBC 10.9 (*)    All other components within normal limits  URINALYSIS,  ROUTINE W REFLEX MICROSCOPIC (NOT AT [facility]) - Abnormal; Notable for the following:    APPearance CLOUDY (*)    Hgb urine dipstick LARGE (*)    All other components within normal limits  URINE MICROSCOPIC-ADD ON - Abnormal; Notable for the following:    Squamous Epithelial / LPF 0-5 (*)    All other components within normal limits    EKG  EKG Interpretation None       Radiology Ct Renal Stone Study  Result Date: 11/25/2015 CLINICAL DATA:  RIGHT-sided flank pain, hematuria today. EXAM: CT ABDOMEN AND PELVIS WITHOUT CONTRAST TECHNIQUE: Multidetector CT imaging of the abdomen and pelvis was performed following the standard protocol without IV contrast. COMPARISON:  Abdominal ultrasound May 28, 2008 FINDINGS: LUNG BASES: Included view of the lung bases are clear. The visualized heart and pericardium are unremarkable. KIDNEYS/BLADDER: Kidneys are orthotopic, demonstrating normal size and morphology. Mild RIGHT hydroureteronephrosis to the mid ureter where a punctate calculus is present, associated with the ureter caliber change. 3 mm RIGHT lower pole nephrolithiasis. No LEFT nephrolithiasis or hydronephrosis. Urinary bladder is partially distended, harboring no intravesicular calculi. SOLID ORGANS: The liver, spleen, gallbladder, pancreas and RIGHT adrenal glands are unremarkable for this non-contrast examination. 12 mm LEFT adrenal benign adenoma, 5 Hounsfield units. GASTROINTESTINAL TRACT: The stomach, small and large bowel are normal in course and caliber without inflammatory changes, the sensitivity may be decreased by lack of enteric contrast. Moderate sigmoid diverticulosis. A few additional scattered colonic diverticula noted. Normal appendix. PERITONEUM/RETROPERITONEUM: Aortoiliac vessels are normal in course and caliber, trace calcific atherosclerosis No lymphadenopathy by CT size criteria. Prostate size is upper limits of normal No intraperitoneal free fluid nor free air. SOFT TISSUES/  OSSEOUS STRUCTURES: Nonsuspicious. Severe L5-S1 disc height loss, vacuum disc and endplate sclerosis/spurring compatible with degenerative disc resulting in moderate to severe LEFT L5-S1 neural foraminal narrowing. IMPRESSION: Punctate RIGHT mid ureter calculus resulting and mild obstructive uropathy. Considering disproportionate obstruction, focal stricture or ureteral mass is a consideration. Recommend follow-up. Electronically Signed   By: Elon Alas M.D.   On: 11/25/2015 22:27    Procedures Procedures (including critical care time)  Medications Ordered in ED Medications  sodium chloride 0.9 % bolus 1,000 mL (0 mLs Intravenous Stopped 11/25/15 2229)  HYDROmorphone (DILAUDID) injection 1 mg (1 mg Intravenous Given 11/25/15 2156)     Initial Impression / Assessment and Plan / ED Course  I have reviewed the triage vital signs and the nursing notes.  Pertinent labs & imaging results that were available during my care of the patient were reviewed by me and considered in my  medical decision making (see chart for details).  Clinical Course    Patient is a 70 year old male past medical history of cerebrovascular disease, hypertension, and hypercholesterolemia who comes in today complaining of blood in urine. Patient states he is in his normal state of health and today was performing yardwork. Patient states he went to shower at that time he urinated and stated that his urine was grossly bloody. Patient then had a sudden onset of sharp right lower quadrant pain. Patient then had another passage of urine which she also noted to be red in color. Patient states that his pain was 10 out of 10. Patient was brought to the ED by his wife and states that through the majority of the ride the patient was in significant pain. Shortly before arrival patient states that his pain resolved spontaneously. Patient currently states that his pain is 2 out of 10 in more of an aching. Patient denies recent illness,  fevers, chills, nausea, vomiting, diarrhea.  Physical exam: Patient cleared auscultation bilaterally normal S1-S2 no rubs murmurs gallops. Abdomen is soft with very mild tenderness to palpation in the right lower quadrant. Patient bowel sounds. Examination of the genitalia showed no abnormality. Patient with possible right-sided inguinal hernia on exam. Remainder of physical exam the normal limits.  Believe patient is likely suffering from kidney stone given sudden onset and the sudden onset of symptoms. We'll collect UA basic labs and CT of the abdomen.  Abdomen CT showed punctate stone in the right ureter at the level of the UPJ. Per radiology note patient potentially has your stricture or mass impeding the passage of the stone. Believe patient will be able to pass the stone without further intervention, however I given strict return in follow-up precautions. I will also instructed the patient to follow-up on the possible abnormality of the right ureter. Patient was understanding and is amenable to discharge at this time.  Final Clinical Impressions(s) / ED Diagnoses   Final diagnoses:  Nephrolithiasis    New Prescriptions Discharge Medication List as of 11/25/2015 11:05 PM    START taking these medications   Details  oxyCODONE-acetaminophen (PERCOCET/ROXICET) 5-325 MG tablet Take 1 tablet by mouth every 4 (four) hours as needed for moderate pain., Starting Sat 11/25/2015, Print         Chapman Moss, MD 11/27/15 2123    Elnora Morrison, MD 11/28/15 [phone]

## 2015-11-25 NOTE — ED Notes (Signed)
Patient presents stating he was mowing the yard today and when he came into shower voided and noticed "alot of blood".  Stated he laid down and took a short nap and got up and voided again and noticed blood again.  Stated he is having pain in the right lower quad area that comes and goes but now "it isn't to bad".  Patient stated he got a little nauseated while coming here but has  Not noticed any further nausea.

## 2015-11-25 NOTE — ED Triage Notes (Signed)
Pt here with flank pain, abdominal pain and hematuria since this afternoon. Pt \\also  reports nausea with dry heaves.

## 2015-12-01 DIAGNOSIS — R319 Hematuria, unspecified: Secondary | ICD-10-CM | POA: Diagnosis not present

## 2015-12-01 DIAGNOSIS — N201 Calculus of ureter: Secondary | ICD-10-CM | POA: Diagnosis not present

## 2015-12-01 DIAGNOSIS — Z6823 Body mass index (BMI) 23.0-23.9, adult: Secondary | ICD-10-CM | POA: Diagnosis not present

## 2015-12-01 DIAGNOSIS — N200 Calculus of kidney: Secondary | ICD-10-CM | POA: Diagnosis not present

## 2015-12-01 DIAGNOSIS — R310 Gross hematuria: Secondary | ICD-10-CM | POA: Diagnosis not present

## 2016-01-01 DIAGNOSIS — N200 Calculus of kidney: Secondary | ICD-10-CM | POA: Diagnosis not present

## 2016-01-01 DIAGNOSIS — N201 Calculus of ureter: Secondary | ICD-10-CM | POA: Diagnosis not present

## 2016-01-01 DIAGNOSIS — Z87448 Personal history of other diseases of urinary system: Secondary | ICD-10-CM | POA: Diagnosis not present

## 2016-01-01 DIAGNOSIS — R310 Gross hematuria: Secondary | ICD-10-CM | POA: Diagnosis not present

## 2016-04-02 DIAGNOSIS — M19019 Primary osteoarthritis, unspecified shoulder: Secondary | ICD-10-CM | POA: Diagnosis not present

## 2016-04-02 DIAGNOSIS — Z6825 Body mass index (BMI) 25.0-25.9, adult: Secondary | ICD-10-CM | POA: Diagnosis not present

## 2016-04-02 DIAGNOSIS — I679 Cerebrovascular disease, unspecified: Secondary | ICD-10-CM | POA: Diagnosis not present

## 2016-04-02 DIAGNOSIS — E7800 Pure hypercholesterolemia, unspecified: Secondary | ICD-10-CM | POA: Diagnosis not present

## 2016-04-02 DIAGNOSIS — N400 Enlarged prostate without lower urinary tract symptoms: Secondary | ICD-10-CM | POA: Diagnosis not present

## 2016-04-02 DIAGNOSIS — R7301 Impaired fasting glucose: Secondary | ICD-10-CM | POA: Diagnosis not present

## 2016-04-02 DIAGNOSIS — I10 Essential (primary) hypertension: Secondary | ICD-10-CM | POA: Diagnosis not present

## 2016-04-02 DIAGNOSIS — M109 Gout, unspecified: Secondary | ICD-10-CM | POA: Diagnosis not present

## 2016-04-02 DIAGNOSIS — Z23 Encounter for immunization: Secondary | ICD-10-CM | POA: Diagnosis not present

## 2016-10-14 DIAGNOSIS — M19019 Primary osteoarthritis, unspecified shoulder: Secondary | ICD-10-CM | POA: Diagnosis not present

## 2016-10-14 DIAGNOSIS — N400 Enlarged prostate without lower urinary tract symptoms: Secondary | ICD-10-CM | POA: Diagnosis not present

## 2016-10-14 DIAGNOSIS — R7301 Impaired fasting glucose: Secondary | ICD-10-CM | POA: Diagnosis not present

## 2016-10-14 DIAGNOSIS — M109 Gout, unspecified: Secondary | ICD-10-CM | POA: Diagnosis not present

## 2016-10-14 DIAGNOSIS — I6789 Other cerebrovascular disease: Secondary | ICD-10-CM | POA: Diagnosis not present

## 2016-10-14 DIAGNOSIS — Z6824 Body mass index (BMI) 24.0-24.9, adult: Secondary | ICD-10-CM | POA: Diagnosis not present

## 2016-10-14 DIAGNOSIS — G4709 Other insomnia: Secondary | ICD-10-CM | POA: Diagnosis not present

## 2016-10-14 DIAGNOSIS — I10 Essential (primary) hypertension: Secondary | ICD-10-CM | POA: Diagnosis not present

## 2016-10-14 DIAGNOSIS — Z0000 Encounter for general adult medical examination without abnormal findings: Secondary | ICD-10-CM | POA: Diagnosis not present

## 2016-10-14 DIAGNOSIS — Z1389 Encounter for screening for other disorder: Secondary | ICD-10-CM | POA: Diagnosis not present

## 2016-10-14 DIAGNOSIS — N200 Calculus of kidney: Secondary | ICD-10-CM | POA: Diagnosis not present

## 2016-10-14 DIAGNOSIS — E7800 Pure hypercholesterolemia, unspecified: Secondary | ICD-10-CM | POA: Diagnosis not present

## 2016-11-05 ENCOUNTER — Encounter: Payer: Self-pay | Admitting: Gastroenterology

## 2016-12-31 ENCOUNTER — Ambulatory Visit (INDEPENDENT_AMBULATORY_CARE_PROVIDER_SITE_OTHER): Payer: BLUE CROSS/BLUE SHIELD | Admitting: Gastroenterology

## 2016-12-31 ENCOUNTER — Encounter (INDEPENDENT_AMBULATORY_CARE_PROVIDER_SITE_OTHER): Payer: Self-pay

## 2016-12-31 ENCOUNTER — Encounter: Payer: Self-pay | Admitting: Gastroenterology

## 2016-12-31 VITALS — BP 146/74 | HR 64 | Ht 66.0 in | Wt 150.0 lb

## 2016-12-31 DIAGNOSIS — R195 Other fecal abnormalities: Secondary | ICD-10-CM | POA: Diagnosis not present

## 2016-12-31 DIAGNOSIS — K921 Melena: Secondary | ICD-10-CM | POA: Diagnosis not present

## 2016-12-31 MED ORDER — NA SULFATE-K SULFATE-MG SULF 17.5-3.13-1.6 GM/177ML PO SOLN: 1.0000 | Freq: Once | ORAL | 0 refills | Status: AC

## 2016-12-31 NOTE — Patient Instructions (Addendum)
You will be set up for a colonoscopy for FOBT + stool (Person).  Normal BMI (Body Mass Index- based on height and weight) is between 23 and 30. Your BMI today is Body mass index is 24.21 kg/m. Marland Kitchen Please consider follow up  regarding your BMI with your Primary Care Provider.

## 2016-12-31 NOTE — Progress Notes (Signed)
HPI: This is a  very pleasant 71 year old man  who was referred to me by Tisovec, Fransico Him, MD  to evaluate  Hemoccult-positive stool .    Chief complaint is Hemoccult-positive stool   He was here in our office 2 or 3 years ago with the same issue. We had been waiting to hear from his previous gastroenterologist but it appears we never did.  He has no overt GI bleeding. No significant troubles with his bowels such as constipation, diarrhea, significant abdominal pains. His weight has been overall stable  Cbc 2018 normal.    Review of systems: Pertinent positive and negative review of systems were noted in the above HPI section. All other review negative.   Past Medical History:  Diagnosis Date  . Cerebrovascular disease 2004  . Gout   . HTN (hypertension)   . Hypercholesterolemia   . Insomnia   . OA (osteoarthritis)    shoulder  . Pneumonia   . Speech and language deficits     Past Surgical History:  Procedure Laterality Date  . HEMORRHOID SURGERY    . TONSILECTOMY, ADENOIDECTOMY, BILATERAL MYRINGOTOMY AND TUBES     as child    Current Outpatient Prescriptions  Medication Sig Dispense Refill  . aspirin 81 MG tablet Take 81 mg by mouth daily.    Marland Kitchen atorvastatin (LIPITOR) 10 MG tablet Take 10 mg by mouth daily.    . fluticasone (FLONASE) 50 MCG/ACT nasal spray USE 2 SPRAYS EACH NOSTRIL TWICE DAILY FOR 4 DAYS, THEN DECREASE TO ONCE DAILY.  "OV NEEDED FOR REFILLS" 16 g 0  . losartan (COZAAR) 50 MG tablet Take 50 mg by mouth daily.    . meclizine (ANTIVERT) 25 MG tablet Take one tablet 3 times daily only if needed for dizziness 30 tablet 0  . oxyCODONE-acetaminophen (PERCOCET/ROXICET) 5-325 MG tablet Take 1 tablet by mouth every 4 (four) hours as needed for moderate pain. 15 tablet 0   No current facility-administered medications for this visit.     Allergies as of 12/31/2016  . (No Known Allergies)    Family History  Problem Relation Age of Onset  . Sudden death  Father        suicide  . Diabetes Mother   . Hypertension Mother   . Gout Mother     Social History   Social History  . Marital status: Married    Spouse name: N/A  . Number of children: 2  . Years of education: N/A   Occupational History  . Dealer    Social History Main Topics  . Smoking status: Never Smoker  . Smokeless tobacco: Never Used  . Alcohol use No  . Drug use: No  . Sexual activity: Not on file   Other Topics Concern  . Not on file   Social History Narrative  . No narrative on file     Physical Exam: BP (!) 146/74   Pulse 64   Ht 5\' 6"  (1.676 m)   Wt 150 lb (68 kg)   BMI 24.21 kg/m  Constitutional: generally well-appearing Psychiatric: alert and oriented x3 Eyes: extraocular movements intact Mouth: oral pharynx moist, no lesions Neck: supple no lymphadenopathy Cardiovascular: heart regular rate and rhythm Lungs: clear to auscultation bilaterally Abdomen: soft, nontender, nondistended, no obvious ascites, no peritoneal signs, normal bowel sounds Extremities: no lower extremity edema bilaterally Skin: no lesions on visible extremities   Assessment and plan: 71 y.o. male with  Hemoccult-positive stool  His last colonoscopy was 10-15 years  ago he believes. This was done in [facility]. He is Hemoccult-positive now by primary care physician. CBC showed his hemoglobin was normal recently. I recommended we proceed with colonoscopy at his soonest convenience and see no reason for any further blood tests or imaging studies prior to then.    Please see the "Patient Instructions" section for addition details about the plan.   Owens Loffler, MD [facility] 12/31/2016, 10:27 AM  Cc: Haywood Pao, MD

## 2017-02-03 ENCOUNTER — Ambulatory Visit (AMBULATORY_SURGERY_CENTER): Payer: BLUE CROSS/BLUE SHIELD | Admitting: Gastroenterology

## 2017-02-03 ENCOUNTER — Encounter: Payer: Self-pay | Admitting: Gastroenterology

## 2017-02-03 VITALS — BP 123/75 | HR 56 | Temp 98.2°F | Resp 10 | Ht 66.0 in | Wt 150.0 lb

## 2017-02-03 DIAGNOSIS — K5730 Diverticulosis of large intestine without perforation or abscess without bleeding: Secondary | ICD-10-CM

## 2017-02-03 DIAGNOSIS — R195 Other fecal abnormalities: Secondary | ICD-10-CM | POA: Diagnosis not present

## 2017-02-03 DIAGNOSIS — D123 Benign neoplasm of transverse colon: Secondary | ICD-10-CM

## 2017-02-03 DIAGNOSIS — D122 Benign neoplasm of ascending colon: Secondary | ICD-10-CM

## 2017-02-03 DIAGNOSIS — I10 Essential (primary) hypertension: Secondary | ICD-10-CM | POA: Diagnosis not present

## 2017-02-03 MED ORDER — SODIUM CHLORIDE 0.9 % IV SOLN: 500.0000 mL | INTRAVENOUS | Status: DC

## 2017-02-03 NOTE — Progress Notes (Signed)
Report to PACU, RN, vss, BBS= Clear.  

## 2017-02-03 NOTE — Progress Notes (Signed)
Called to room to assist during endoscopic procedure.  Patient ID and intended procedure confirmed with present staff. Received instructions for my participation in the procedure from the performing physician.  

## 2017-02-03 NOTE — Progress Notes (Signed)
Pt's states no medical or surgical changes since previsit or office visit. 

## 2017-02-03 NOTE — Patient Instructions (Signed)
YOU HAD AN ENDOSCOPIC PROCEDURE TODAY AT THE [facility]:   Refer to the procedure report that was given to you for any specific questions about what was found during the examination.  If the procedure report does not answer your questions, please call your gastroenterologist to clarify.  If you requested that your care partner not be given the details of your procedure findings, then the procedure report has been included in a sealed envelope for you to review at your convenience later.  YOU SHOULD EXPECT: Some feelings of bloating in the abdomen. Passage of more gas than usual.  Walking can help get rid of the air that was put into your GI tract during the procedure and reduce the bloating. If you had a lower endoscopy (such as a colonoscopy or flexible sigmoidoscopy) you may notice spotting of blood in your stool or on the toilet paper. If you underwent a bowel prep for your procedure, you may not have a normal bowel movement for a few days.  Please Note:  You might notice some irritation and congestion in your nose or some drainage.  This is from the oxygen used during your procedure.  There is no need for concern and it should clear up in a day or so.  SYMPTOMS TO REPORT IMMEDIATELY:   Following lower endoscopy (colonoscopy or flexible sigmoidoscopy):  Excessive amounts of blood in the stool  Significant tenderness or worsening of abdominal pains  Swelling of the abdomen that is new, acute  Fever of 100F or higher  For urgent or emergent issues, a gastroenterologist can be reached at any hour by calling [phone].   DIET:  We do recommend a small meal at first, but then you may proceed to your regular diet.  Drink plenty of fluids but you should avoid alcoholic beverages for 24 hours.  ACTIVITY:  You should plan to take it easy for the rest of today and you should NOT DRIVE or use heavy machinery until tomorrow (because of the sedation medicines used during the test).     FOLLOW UP: Our staff will call the number listed on your records the next business day following your procedure to check on you and address any questions or concerns that you may have regarding the information given to you following your procedure. If we do not reach you, we will leave a message.  However, if you are feeling well and you are not experiencing any problems, there is no need to return our call.  We will assume that you have returned to your regular daily activities without incident.  If any biopsies were taken you will be contacted by phone or by letter within the next 1-3 weeks.  Please call us at [phone] if you have not heard about the biopsies in 3 weeks.   Await for biopsy results to determine next repeat Colonoscopy screening Polyps (handout given) Diverticulosis (handout given)   SIGNATURES/CONFIDENTIALITY: You and/or your care partner have signed paperwork which will be entered into your electronic medical record.  These signatures attest to the fact that that the information above on your After Visit Summary has been reviewed and is understood.  Full responsibility of the confidentiality of this discharge information lies with you and/or your care-partner. 

## 2017-02-03 NOTE — Op Note (Signed)
[facility] Patient Name: Joe Richards Procedure Date: 02/03/2017 2:04 PM MRN: [id number] Endoscopist: Milus Banister , MD Age: 71 Referring MD:  Date of Birth: 01/14/1946 Gender: Male Account #: [id number] Procedure:                Colonoscopy Indications:              Heme positive stool Medicines:                Monitored Anesthesia Care Procedure:                Pre-Anesthesia Assessment:                           - Prior to the procedure, a History and Physical                            was performed, and patient medications and                            allergies were reviewed. The patient's tolerance of                            previous anesthesia was also reviewed. The risks                            and benefits of the procedure and the sedation                            options and risks were discussed with the patient.                            All questions were answered, and informed consent                            was obtained. Prior Anticoagulants: The patient has                            taken no previous anticoagulant or antiplatelet                            agents. ASA Grade Assessment: II - A patient with                            mild systemic disease. After reviewing the risks                            and benefits, the patient was deemed in                            satisfactory condition to undergo the procedure.                           After obtaining informed consent, the colonoscope  was passed under direct vision. Throughout the                            procedure, the patient's blood pressure, pulse, and                            oxygen saturations were monitored continuously. The                            Colonoscope was introduced through the anus and                            advanced to the the cecum, identified by                            appendiceal orifice and ileocecal valve. The                           colonoscopy was performed without difficulty. The                            patient tolerated the procedure well. The quality                            of the bowel preparation was good. The ileocecal                            valve, appendiceal orifice, and rectum were                            photographed. Scope In: 2:08:38 PM Scope Out: 2:20:48 PM Scope Withdrawal Time: 0 hours 9 minutes 12 seconds  Total Procedure Duration: 0 hours 12 minutes 10 seconds  Findings:                 Three sessile polyps were found in the transverse                            colon and ascending colon. The polyps were 2 to 3                            mm in size. These polyps were removed with a cold                            snare. Resection and retrieval were complete.                           Multiple small and large-mouthed diverticula were                            found in the left colon.                           The exam was otherwise without abnormality on  direct and retroflexion views. Complications:            No immediate complications. Estimated blood loss:                            None. Estimated Blood Loss:     Estimated blood loss: none. Impression:               - Three 2 to 3 mm polyps in the transverse colon                            and in the ascending colon, removed with a cold                            snare. Resected and retrieved.                           - Diverticulosis in the left colon.                           - The examination was otherwise normal on direct                            and retroflexion views. Recommendation:           - Patient has a contact number available for                            emergencies. The signs and symptoms of potential                            delayed complications were discussed with the                            patient. Return to normal activities tomorrow.                             Written discharge instructions were provided to the                            patient.                           - Resume previous diet.                           - Continue present medications.                           You will receive a letter within 2-3 weeks with the                            pathology results and my final recommendations.                           If the polyp(s) is proven to be 'pre-cancerous' on  pathology, you will need repeat colonoscopy in 3-5                            years. If the polyp(s) is NOT 'precancerous' on                            pathology then you should repeat colon cancer                            screening in 10 years with colonoscopy without need                            for colon cancer screening by any method prior to                            then (including stool testing). Milus Banister, MD 02/03/2017 2:22:56 PM This report has been signed electronically.

## 2017-02-04 ENCOUNTER — Telehealth: Payer: Self-pay | Admitting: *Deleted

## 2017-02-04 NOTE — Telephone Encounter (Signed)
  Follow up Call-  Call back number 02/03/2017  Post procedure Call Back phone  # [phone]  Permission to leave phone message Yes  Some recent data might be hidden     Patient questions:  Do you have a fever, pain , or abdominal swelling? No. Pain Score  0 *  Have you tolerated food without any problems? Yes.    Have you been able to return to your normal activities? Yes.    Do you have any questions about your discharge instructions: Diet   No. Medications  No. Follow up visit  No.  Do you have questions or concerns about your Care? No.  Actions: * If pain score is 4 or above: No action needed, pain <4.

## 2017-02-09 ENCOUNTER — Encounter: Payer: Self-pay | Admitting: Gastroenterology

## 2018-01-21 DIAGNOSIS — Z01818 Encounter for other preprocedural examination: Secondary | ICD-10-CM | POA: Diagnosis not present

## 2018-01-21 DIAGNOSIS — M1712 Unilateral primary osteoarthritis, left knee: Secondary | ICD-10-CM | POA: Diagnosis not present

## 2018-04-07 IMAGING — CT CT RENAL STONE PROTOCOL
2 of 4 series · 11 of 46 positions shown, 12 images · non-contrast
Comparison: Abdominal ultrasound May 28, 2008

CLINICAL DATA: RIGHT-sided flank pain, hematuria today.

EXAM:
CT ABDOMEN AND PELVIS WITHOUT CONTRAST
TECHNIQUE: Multidetector CT imaging of the abdomen and pelvis was performed
following the standard protocol without IV contrast.

[Series 201: stone study, idose (2) · axial · 0.67mm/px · z∈[+85,+465]mm · 8 of 92 slices shown, 9 images]
[im 8/92  soft-tissue]
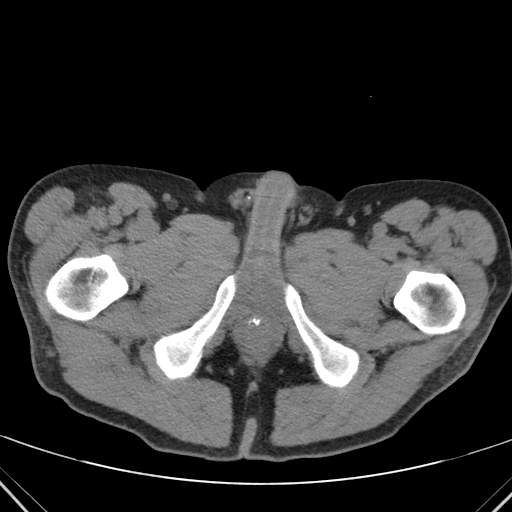
[im 8/92  bone]
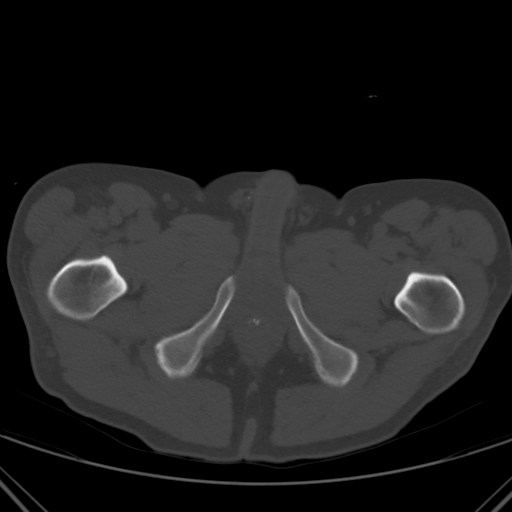
[im 19/92  soft-tissue]
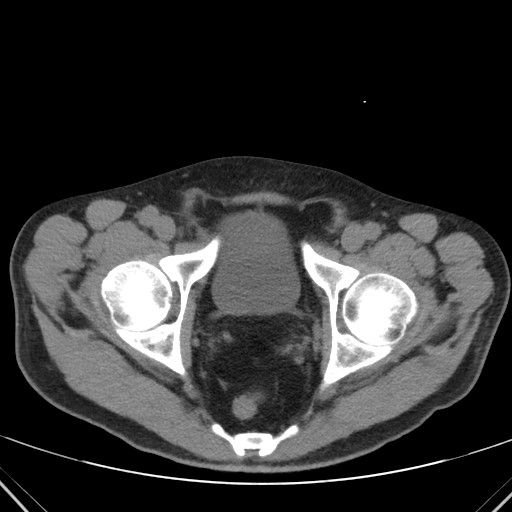
[im 30/92  soft-tissue]
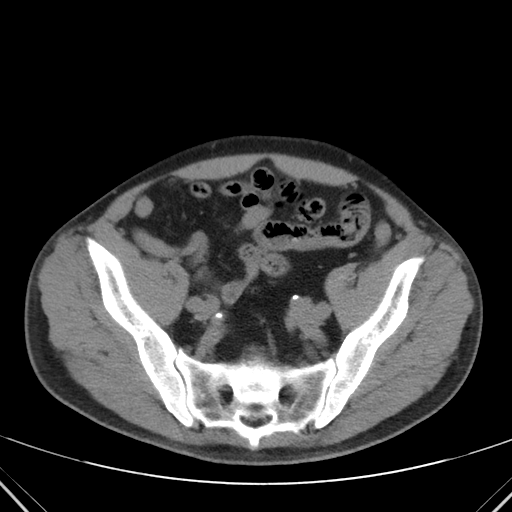
[im 41/92  soft-tissue]
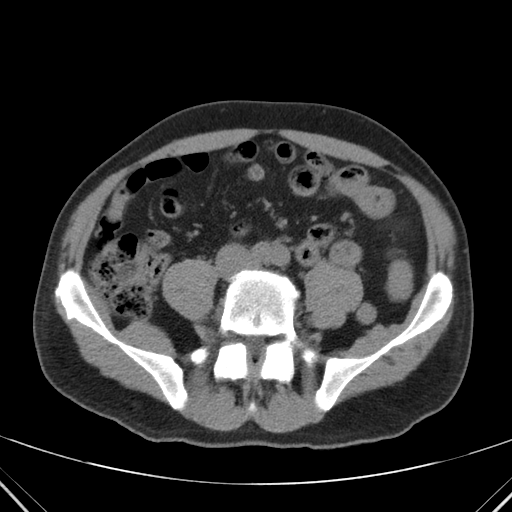
[im 51/92  soft-tissue]
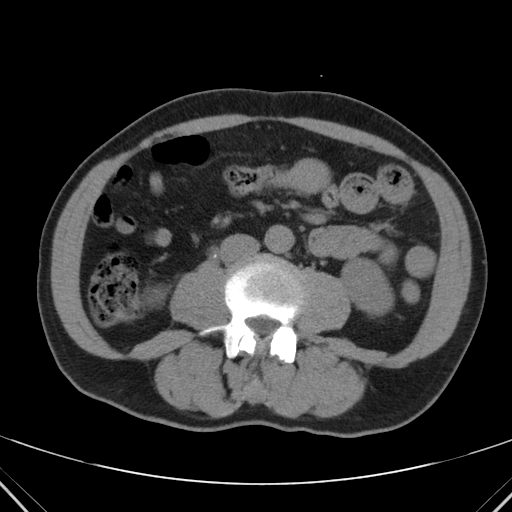
[im 62/92  soft-tissue]
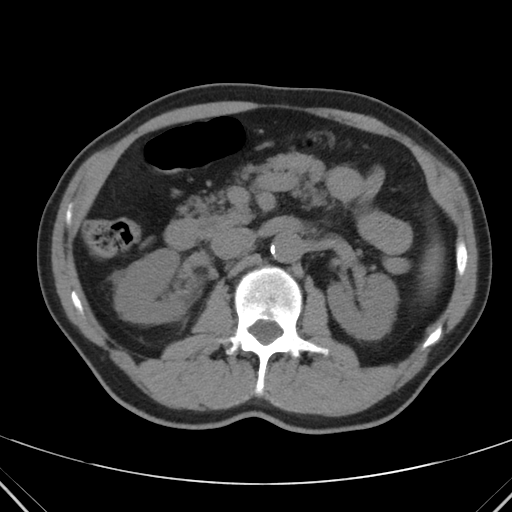
[im 73/92  soft-tissue]
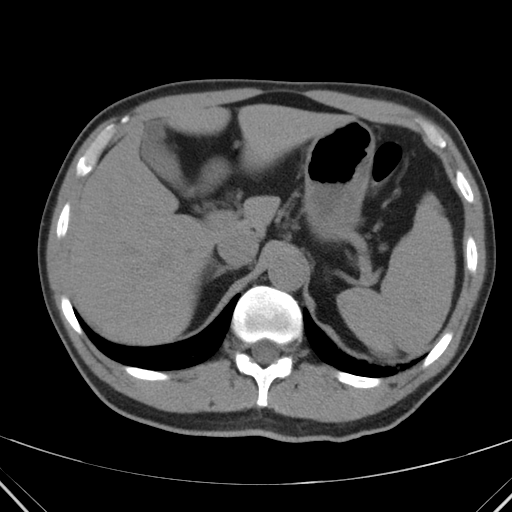
[im 84/92  soft-tissue]
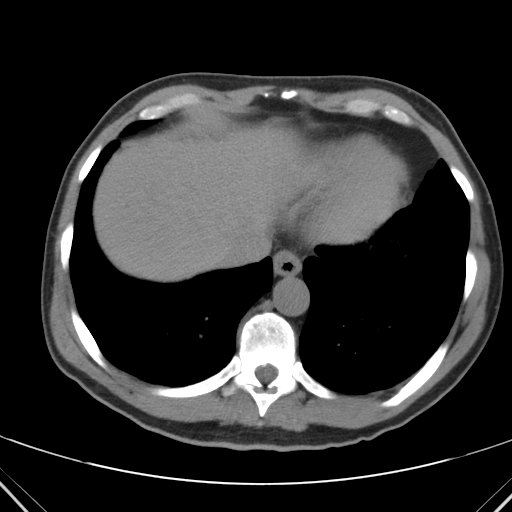

[Series 203: coronals, idose (2) · coronal · 0.45mm/px · 3 of 106 slices shown]
[im 36/106  soft-tissue]
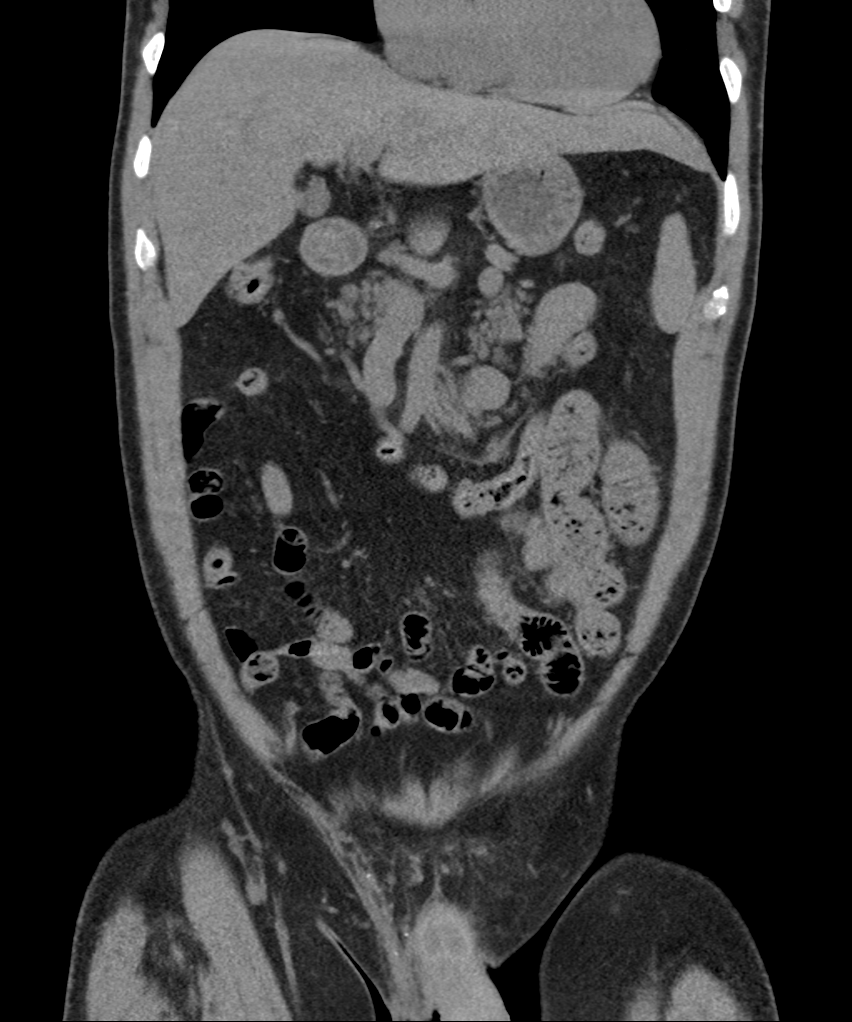
[im 47/106  soft-tissue]
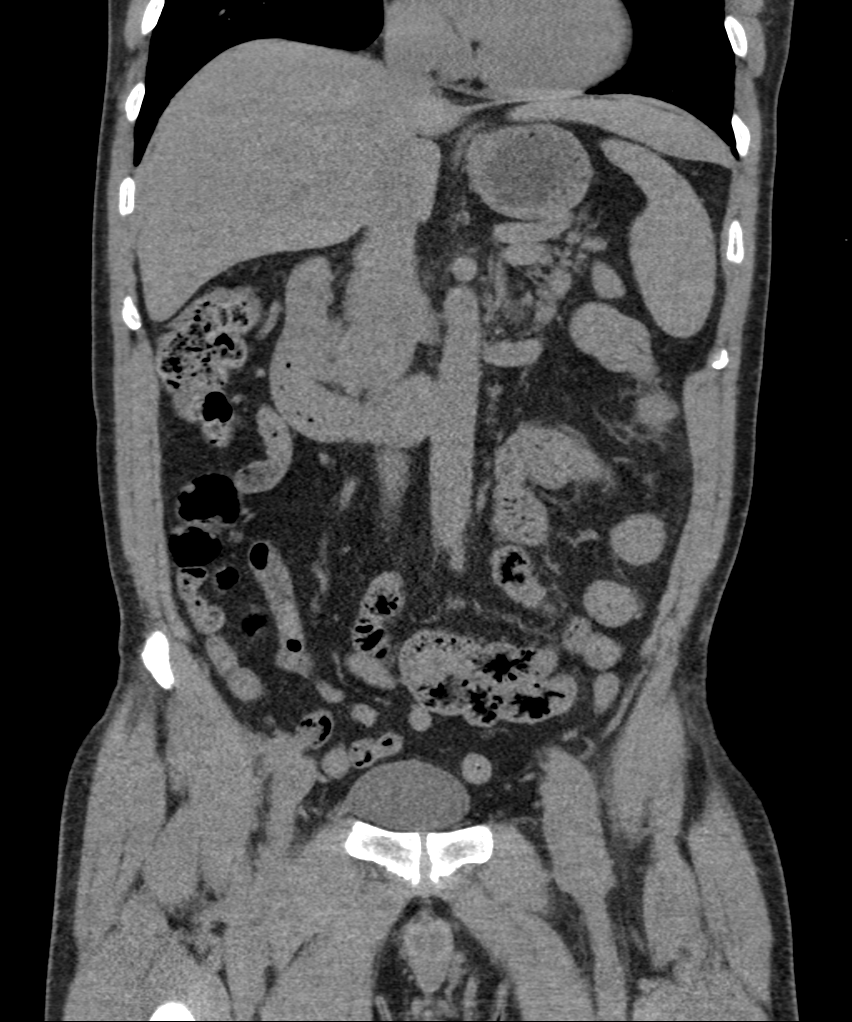
[im 59/106  soft-tissue]
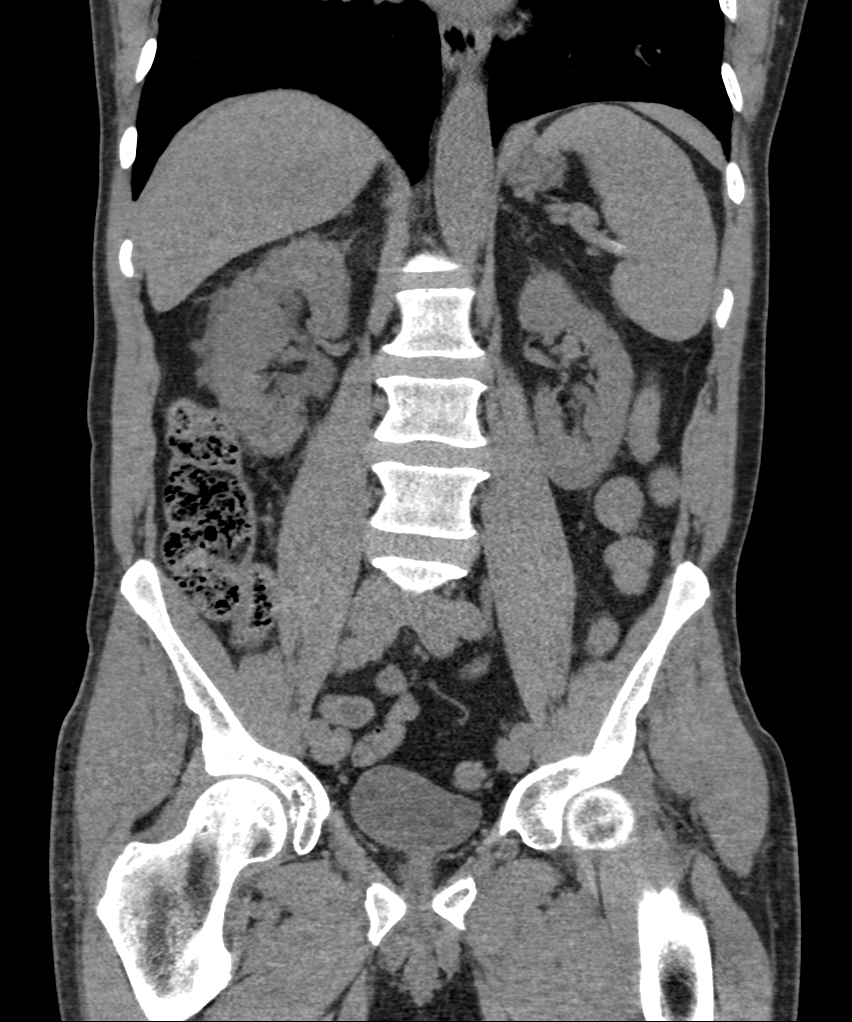

[11 of 46 positions shown; findings below may reference images not displayed]

FINDINGS: LUNG BASES: Included view of the lung bases are clear. The
visualized heart and pericardium are unremarkable.

KIDNEYS/BLADDER: Kidneys are orthotopic, demonstrating normal size
and morphology. Mild RIGHT hydroureteronephrosis to the mid ureter
where a punctate calculus is present, associated with the ureter
caliber change. 3 mm RIGHT lower pole nephrolithiasis. No LEFT
nephrolithiasis or hydronephrosis. Urinary bladder is partially
distended, harboring no intravesicular calculi.

SOLID ORGANS: The liver, spleen, gallbladder, pancreas and RIGHT
adrenal glands are unremarkable for this non-contrast examination.
12 mm LEFT adrenal benign adenoma, 5 Hounsfield units.

GASTROINTESTINAL TRACT: The stomach, small and large bowel are
normal in course and caliber without inflammatory changes, the
sensitivity may be decreased by lack of enteric contrast. Moderate
sigmoid diverticulosis. A few additional scattered colonic
diverticula noted. Normal appendix.

PERITONEUM/RETROPERITONEUM: Aortoiliac vessels are normal in course
and caliber, trace calcific atherosclerosis No lymphadenopathy by CT
size criteria. Prostate size is upper limits of normal No
intraperitoneal free fluid nor free air.

SOFT TISSUES/ OSSEOUS STRUCTURES: Nonsuspicious. Severe L5-S1 disc
height loss, vacuum disc and endplate sclerosis/spurring compatible
with degenerative disc resulting in moderate to severe LEFT L5-S1
neural foraminal narrowing.
IMPRESSION: Punctate RIGHT mid ureter calculus resulting and mild obstructive
uropathy. Considering disproportionate obstruction, focal stricture
or ureteral mass is a consideration. Recommend follow-up.

## 2020-03-30 DIAGNOSIS — Z20828 Contact with and (suspected) exposure to other viral communicable diseases: Secondary | ICD-10-CM | POA: Diagnosis not present

## 2020-03-30 DIAGNOSIS — R059 Cough, unspecified: Secondary | ICD-10-CM | POA: Diagnosis not present
